# Patient Record
Sex: Female | Born: 1980 | Race: White | Hispanic: No | Marital: Single | Smoking: Current every day smoker
Health system: Southern US, Community
[De-identification: ages and names within clinical notes are randomized; demographics above are authoritative.]

---

## 2000-01-24 ENCOUNTER — Emergency Department (HOSPITAL_COMMUNITY): Admission: EM | Admit: 2000-01-24 | Discharge: 2000-01-24 | Payer: Self-pay | Admitting: Emergency Medicine

## 2000-01-24 ENCOUNTER — Encounter: Payer: Self-pay | Admitting: Emergency Medicine

## 2002-09-30 ENCOUNTER — Encounter: Payer: Self-pay | Admitting: Emergency Medicine

## 2002-09-30 ENCOUNTER — Emergency Department (HOSPITAL_COMMUNITY): Admission: EM | Admit: 2002-09-30 | Discharge: 2002-09-30 | Payer: Self-pay | Admitting: Emergency Medicine

## 2002-11-07 ENCOUNTER — Other Ambulatory Visit: Admission: RE | Admit: 2002-11-07 | Discharge: 2002-11-07 | Payer: Self-pay | Admitting: Obstetrics and Gynecology

## 2003-06-01 ENCOUNTER — Inpatient Hospital Stay (HOSPITAL_COMMUNITY): Admission: AD | Admit: 2003-06-01 | Discharge: 2003-06-03 | Payer: Self-pay | Admitting: Obstetrics and Gynecology

## 2003-07-06 ENCOUNTER — Other Ambulatory Visit: Admission: RE | Admit: 2003-07-06 | Discharge: 2003-07-06 | Payer: Self-pay | Admitting: Obstetrics and Gynecology

## 2003-09-11 ENCOUNTER — Emergency Department (HOSPITAL_COMMUNITY): Admission: EM | Admit: 2003-09-11 | Discharge: 2003-09-11 | Payer: Self-pay | Admitting: Emergency Medicine

## 2003-09-11 ENCOUNTER — Encounter: Payer: Self-pay | Admitting: Emergency Medicine

## 2003-10-28 ENCOUNTER — Emergency Department (HOSPITAL_COMMUNITY): Admission: EM | Admit: 2003-10-28 | Discharge: 2003-10-28 | Payer: Self-pay | Admitting: Emergency Medicine

## 2004-01-07 ENCOUNTER — Emergency Department (HOSPITAL_COMMUNITY): Admission: EM | Admit: 2004-01-07 | Discharge: 2004-01-07 | Payer: Self-pay | Admitting: Emergency Medicine

## 2005-07-19 ENCOUNTER — Emergency Department (HOSPITAL_COMMUNITY): Admission: EM | Admit: 2005-07-19 | Discharge: 2005-07-20 | Payer: Self-pay | Admitting: Emergency Medicine

## 2005-07-21 ENCOUNTER — Emergency Department (HOSPITAL_COMMUNITY): Admission: RE | Admit: 2005-07-21 | Discharge: 2005-07-21 | Payer: Self-pay | Admitting: Emergency Medicine

## 2006-06-22 IMAGING — CT CT ABDOMEN W/ CM
1 of 4 series · 14 of 32 positions shown, 19 images · IV contrast (omnipaque)
Comparison: none

CLINICAL DATA: Abdominal pain.
 ABDOMEN CT WITH CONTRAST:
TECHNIQUE: Multidetector CT imaging of the abdomen was performed following the standard protocol during bolus administration of intravenous contrast.
 Contrast:  125  cc Omnipaque 300.
 The lungs are clear without pericardial or pleural effusions.  Mesenteric fatty stranding is seen anteriorly, images 30-33.  This is superior to the stomach and I question whether it is related to an underlying gastric process such as ulcerative disease.  There is no free air noted.  No gastric mass is identified.  Small and large bowel are unremarkable.  There are no other mesenteric inflammatory changes appreciated.  Specifically, the right lower quadrant is unremarkable.  The liver, gallbladder and pancreas are unremarkable.  Kidneys show symmetric uptake and excretion of contrast.  Spleen is normal.
TECHNIQUE: Multidetector CT imaging of the pelvis was performed following the standard protocol during bolus administration of intravenous contrast.
 Low density focus, right adnexa.  Probable incidental cyst.  Uterus normal for age.  Negative for abnormal inflammatory changes.  Sigmoid colon and rectum unremarkable.

[Series 2: abd_pel 5.0 b40f st · axial · 0.55mm/px · z∈[-574,-164]mm · 14 of 94 slices shown, 19 images]
[im 6/94  soft-tissue]
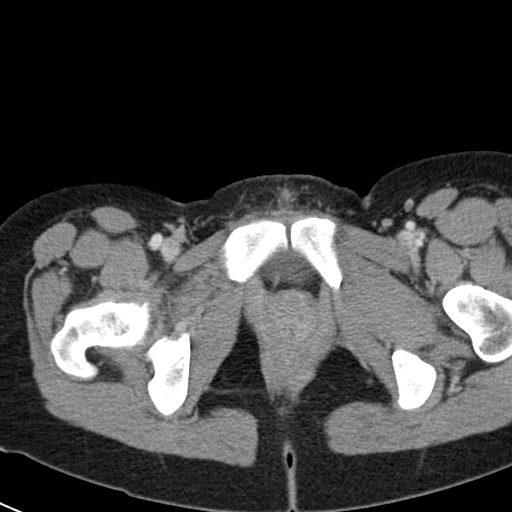
[im 6/94  bone]
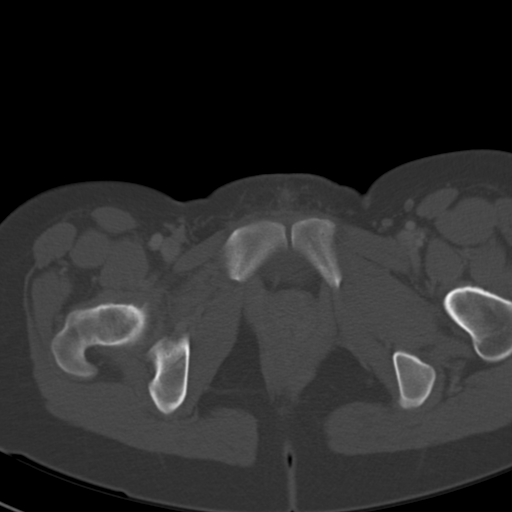
[im 11/94  soft-tissue]
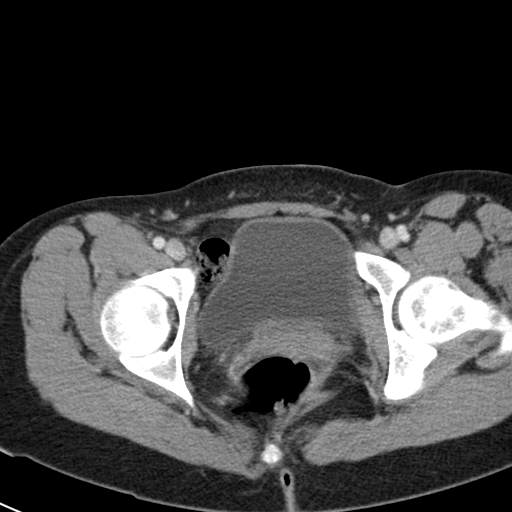
[im 21/94  soft-tissue]
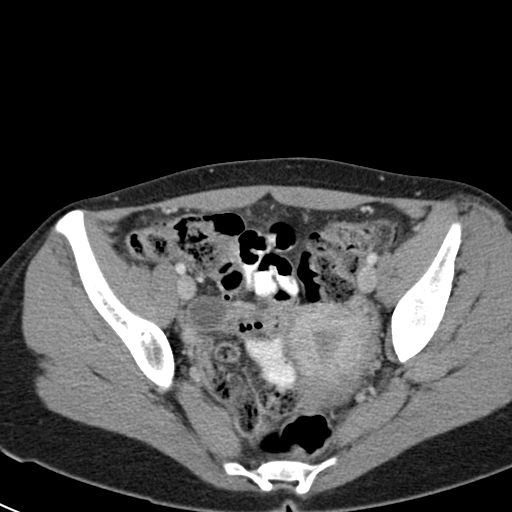
[im 26/94  soft-tissue]
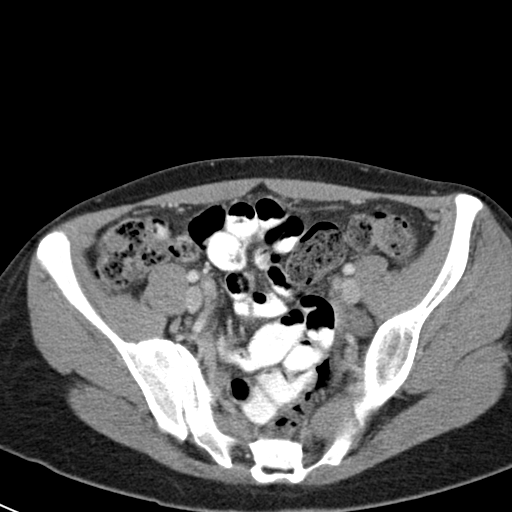
[im 32/94  soft-tissue]
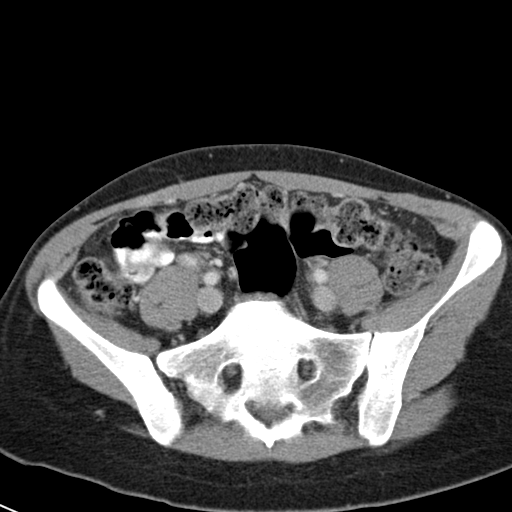
[im 42/94  soft-tissue]
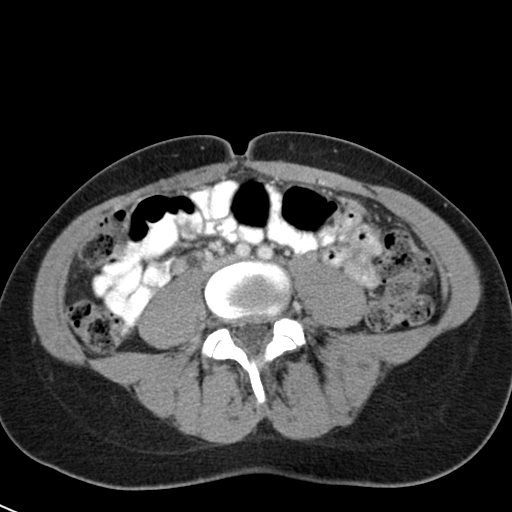
[im 47/94  soft-tissue]
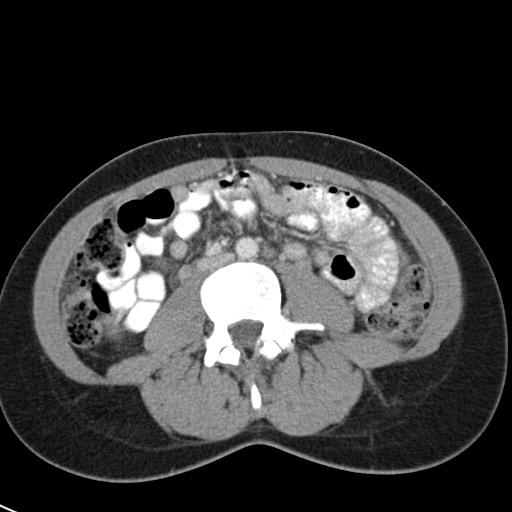
[im 52/94  soft-tissue]
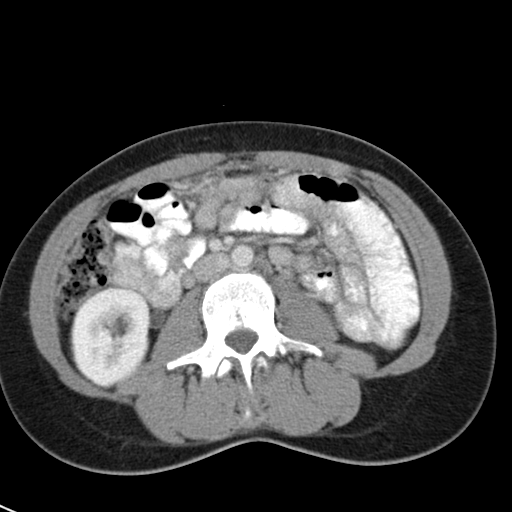
[im 63/94  soft-tissue]
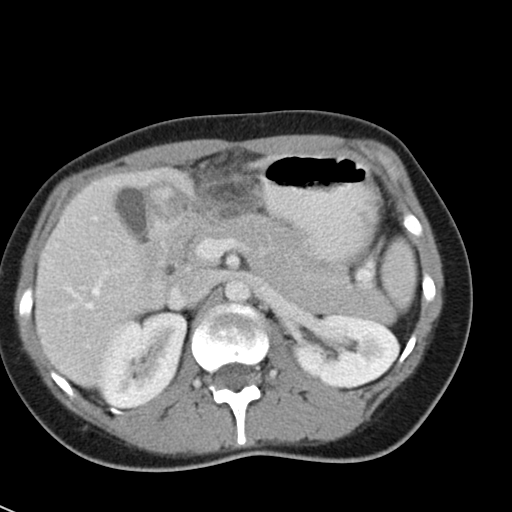
[im 63/94  bone]
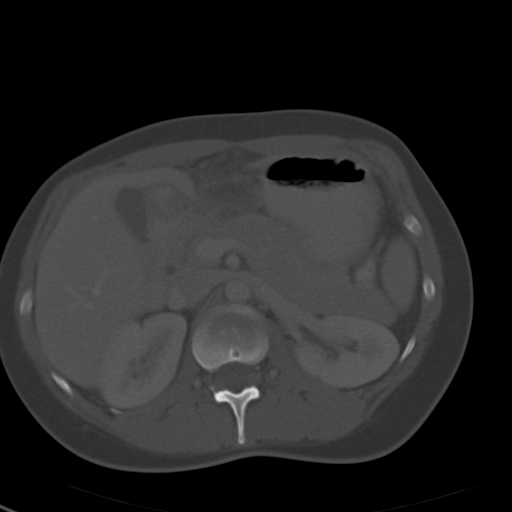
[im 68/94  soft-tissue]
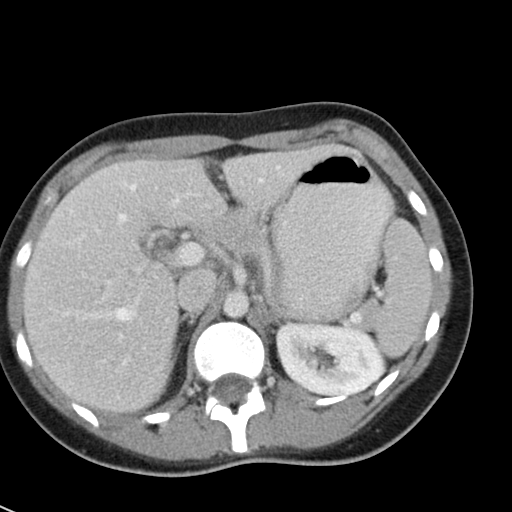
[im 73/94  soft-tissue]
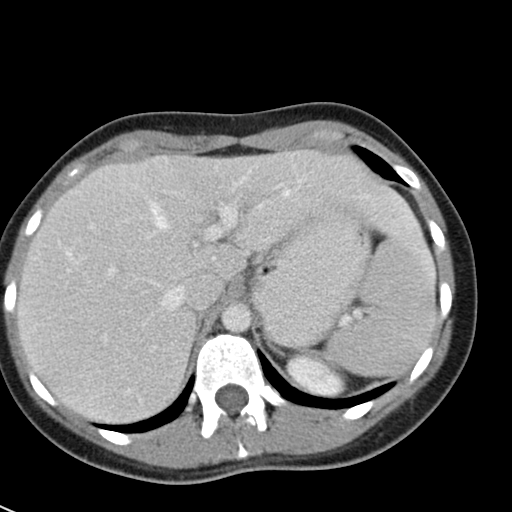
[im 73/94  lung]
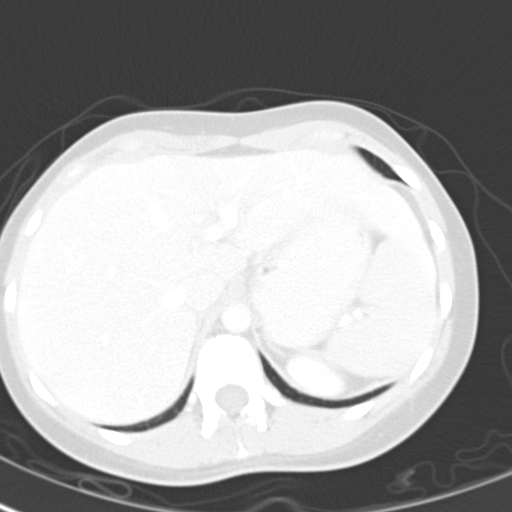
[im 78/94  lung]
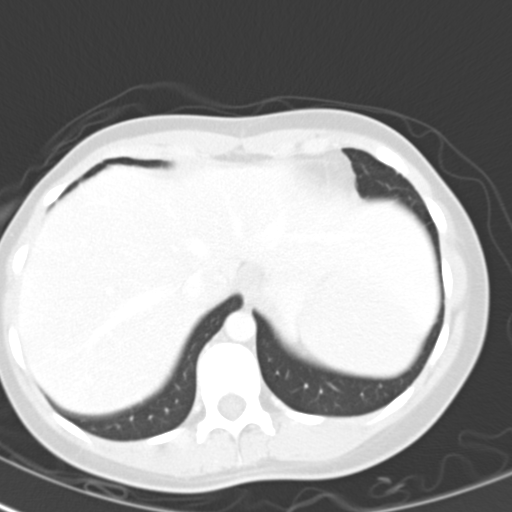
[im 83/94  soft-tissue]
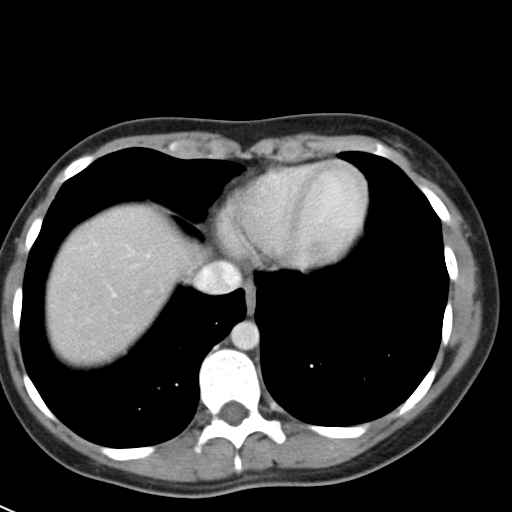
[im 83/94  lung]
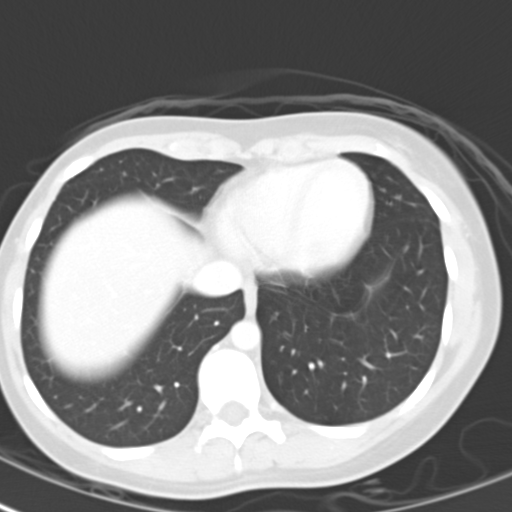
[im 88/94  soft-tissue]
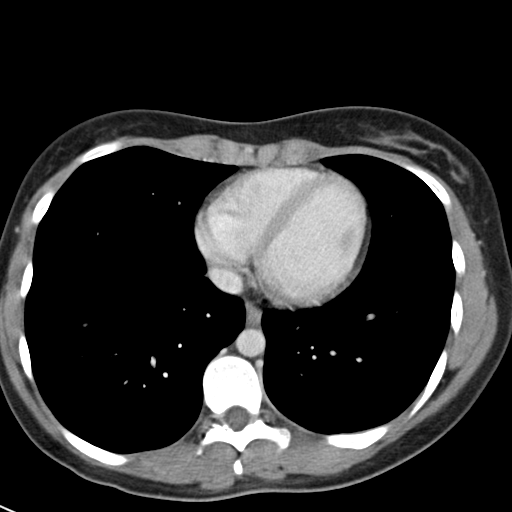
[im 88/94  lung]
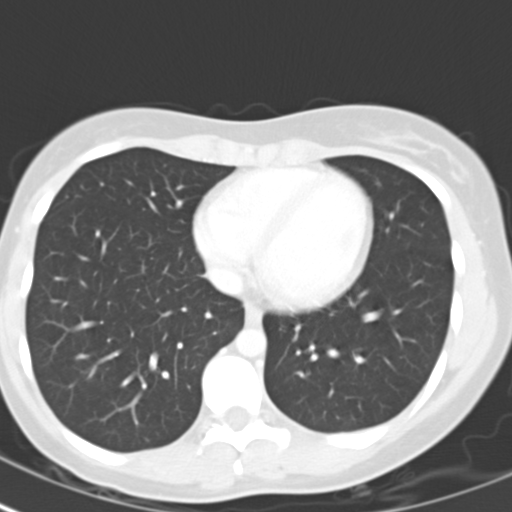

[14 of 32 positions shown; findings below may reference images not displayed]

IMPRESSION: Abnormal mesenteric fatty stranding seen anteriorly, at the level of the stomach.  Question underlying gastric inflammatory process.  Crohn?s disease is a differential consideration.  Ulcerative colitis is believed to be less likely.  
 PELVIS CT WITH CONTRAST:
IMPRESSION: Probable right adnexal cyst, otherwise negative.

## 2007-03-28 ENCOUNTER — Emergency Department (HOSPITAL_COMMUNITY): Admission: EM | Admit: 2007-03-28 | Discharge: 2007-03-28 | Payer: Self-pay | Admitting: Emergency Medicine

## 2008-09-20 ENCOUNTER — Ambulatory Visit (HOSPITAL_COMMUNITY): Admission: RE | Admit: 2008-09-20 | Discharge: 2008-09-20 | Payer: Self-pay | Admitting: Obstetrics and Gynecology

## 2008-11-20 ENCOUNTER — Inpatient Hospital Stay (HOSPITAL_COMMUNITY): Admission: AD | Admit: 2008-11-20 | Discharge: 2008-11-20 | Payer: Self-pay | Admitting: Obstetrics and Gynecology

## 2008-12-20 ENCOUNTER — Inpatient Hospital Stay (HOSPITAL_COMMUNITY): Admission: AD | Admit: 2008-12-20 | Discharge: 2008-12-22 | Payer: Self-pay | Admitting: Obstetrics and Gynecology

## 2009-08-22 IMAGING — US US RENAL
1 series · 13 of 25 positions shown · non-contrast
Comparison: Prior ultrasound of 07/21/2005

CLINICAL DATA: Abdominal pain.  Hematuria.

RENAL/URINARY TRACT ULTRASOUND
TECHNIQUE: Complete ultrasound examination of the urinary tract
was performed including evaluation of the kidneys, renal collecting
systems, and urinary bladder.

[Series 1: us renal · 13 of 46 slices shown]
[im 1/46]
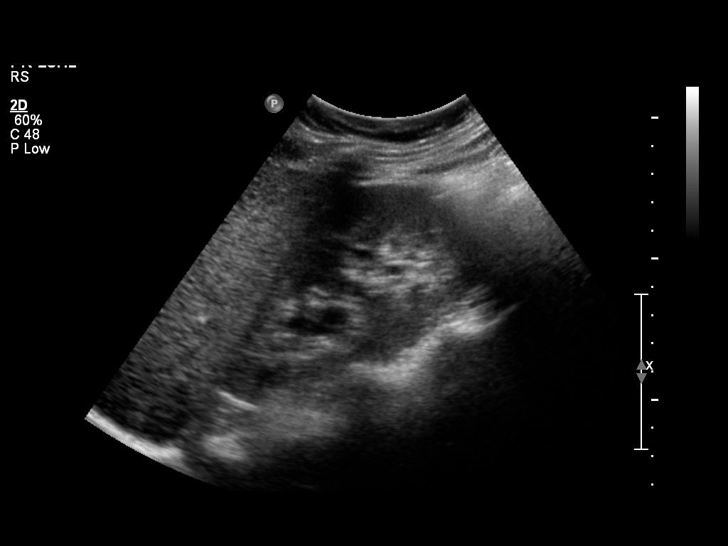
[im 4/46]
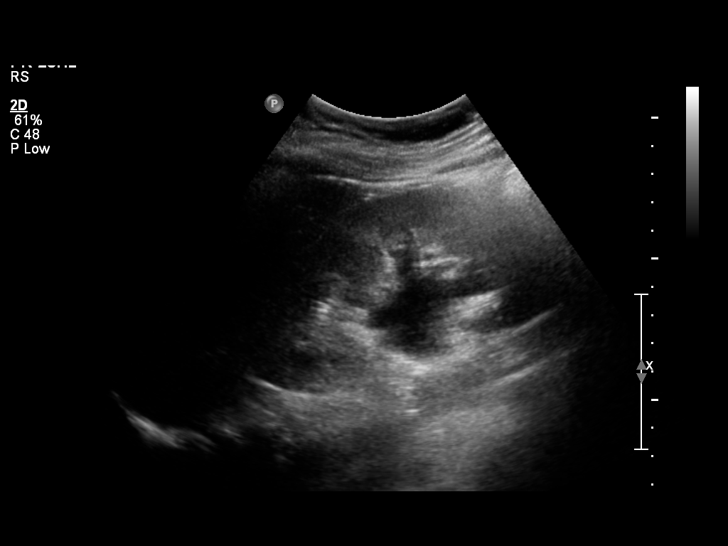
[im 8/46]
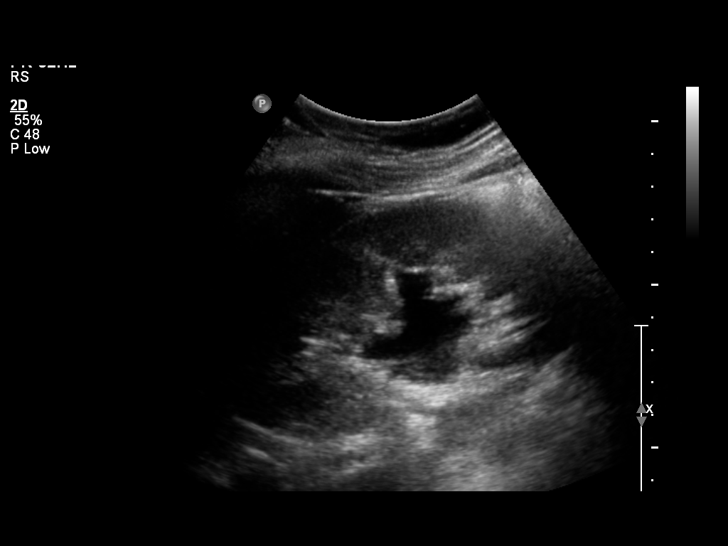
[im 12/46]
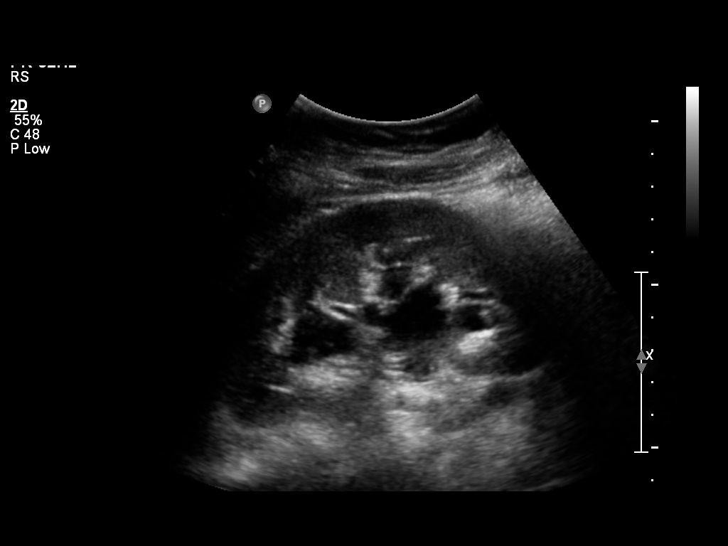
[im 16/46]
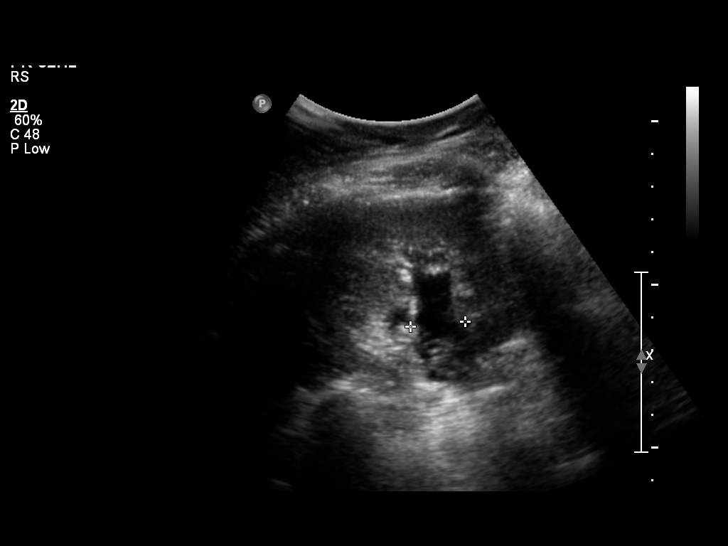
[im 19/46]
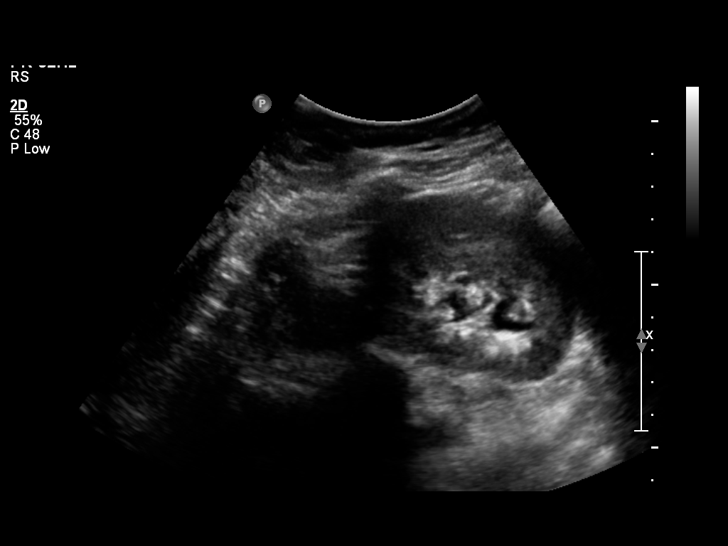
[im 23/46]
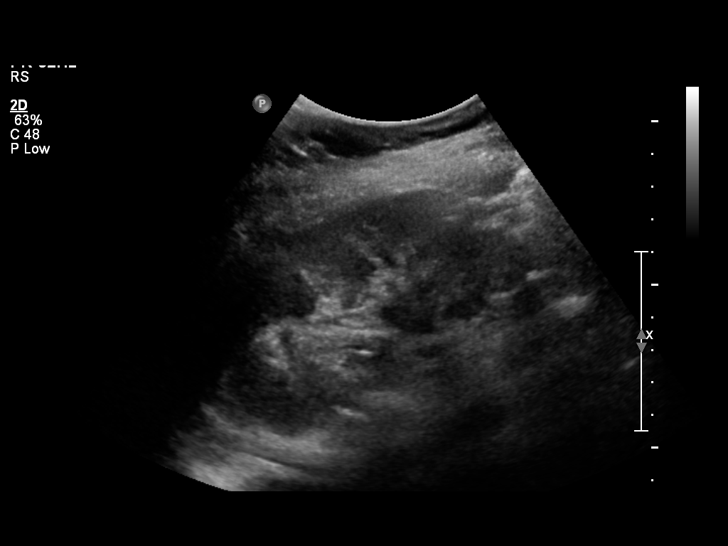
[im 27/46]
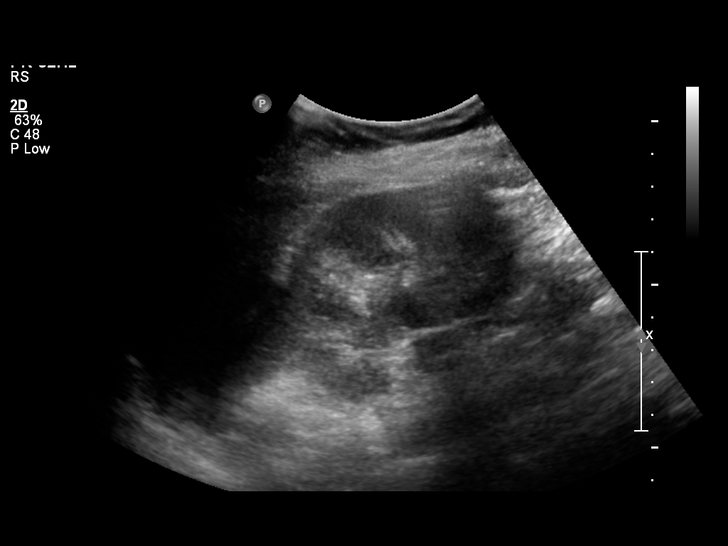
[im 31/46]
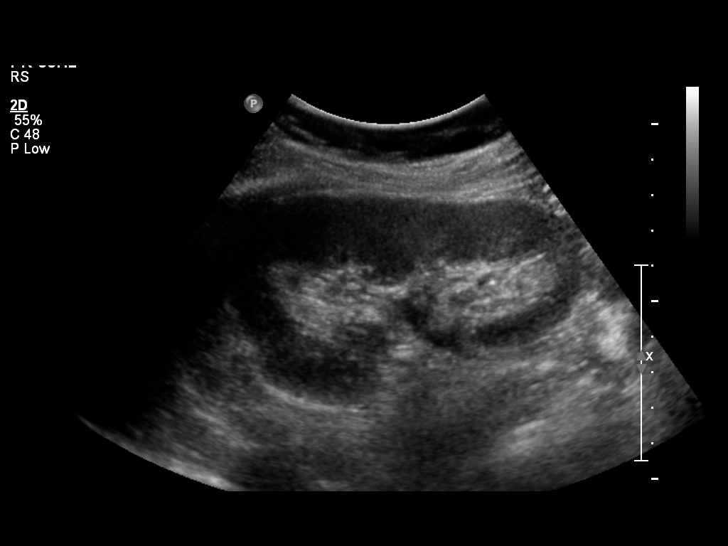
[im 34/46]
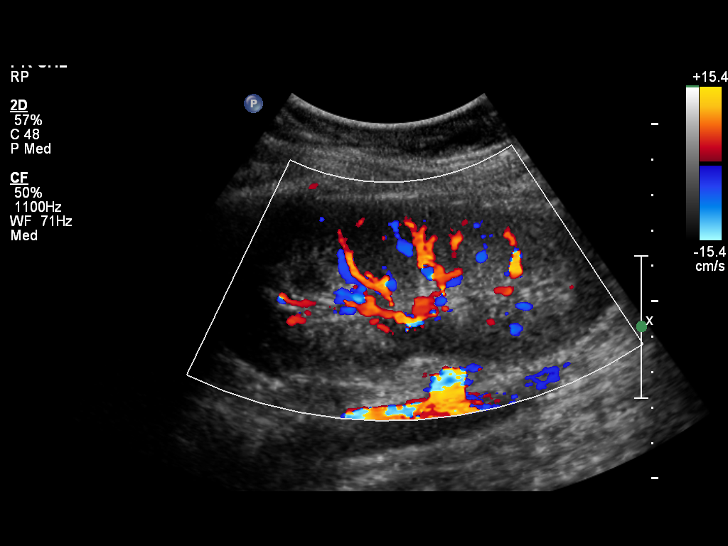
[im 38/46]
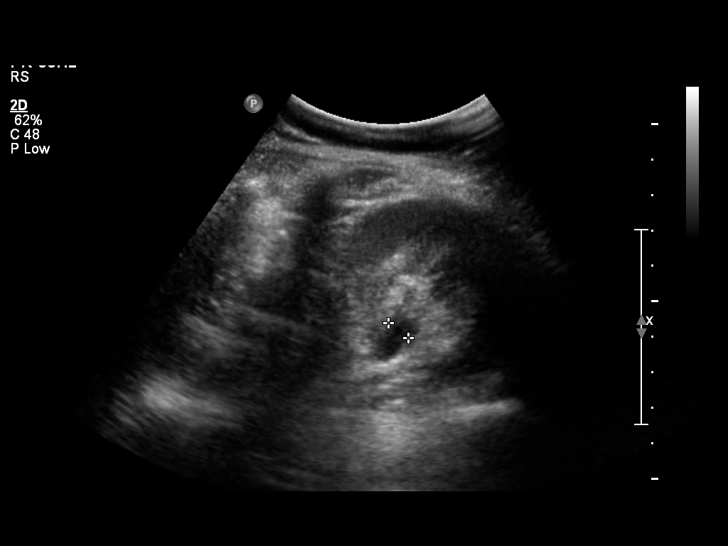
[im 42/46]
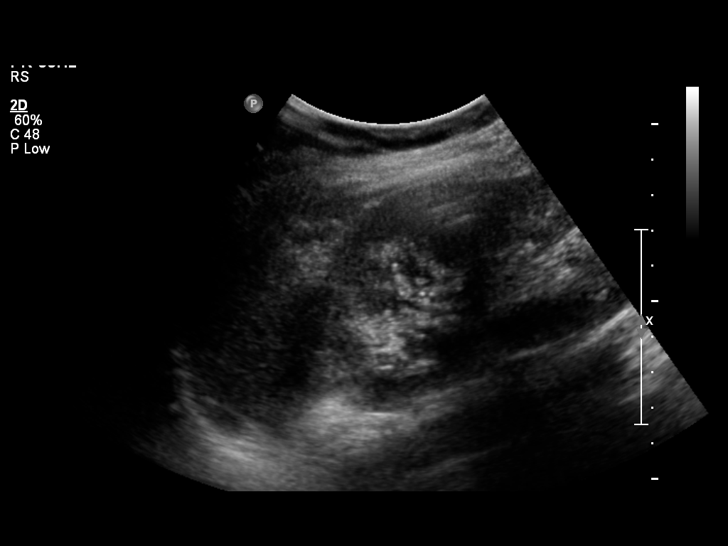
[im 46/46]
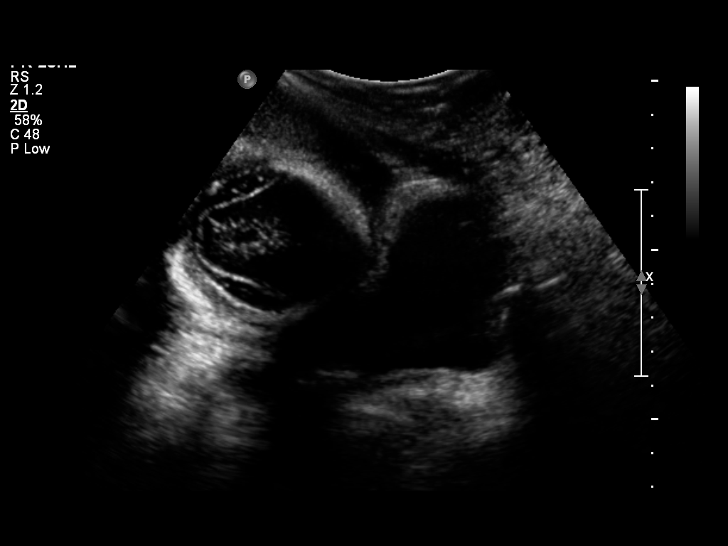

[13 of 25 positions shown; findings below may reference images not displayed]

FINDINGS: The right kidney measures 10.4 cm in greatest length and
demonstrates mild right hydronephrosis.  No discrete renal calculus
is identified.    No definite right renal scarring is identified.
No renal masses noted.

Several transverse images demonstrate possible nonshadowing filling
defects in the dilated calyces. I personally spoke with the
ultrasound technologist, who noted that at real time imaging these
represented the tips of medullary pyramids rather than true filling
defects.

The left kidney measures 10.0 cm in greatest length, and appears
otherwise unremarkable sonographically.

The urinary bladder demonstrates a left-sided ureteral jet.
Consistent right ureteral jet was not shown on today's exam.
Gravid uterus is noted adjacent to the urinary bladder.
IMPRESSION: 1.  Mild right hydronephrosis, and lack of demonstrable right
ureteral jet. This raises the possibility of partial right ureteral
obstruction.
2.  No visualized stone or mass.

## 2011-05-06 NOTE — H&P (Signed)
NAME:  Karen Sandoval, Karen Sandoval NO.:  [id number]   MEDICAL RECORD NO.:  [id number]          PATIENT TYPE:  INP   LOCATION:  9146                          FACILITY:  [facility]   PHYSICIAN:  Lenoard Aden, M.D.DATE OF BIRTH:  1980-12-25   DATE OF ADMISSION:  12/20/2008  DATE OF DISCHARGE:                              HISTORY & PHYSICAL   CHIEF COMPLAINT:  Labor.   She is a 30 year old white female, G2, P1, at 32 week's gestation in  active labor.   She has allergies to PENICILLIN.   MEDICATIONS:  Prenatal vitamins.   Nonsmoker, nondrinker.  Denies domestic or physical violence.   Family history of heart disease, diabetes, breast cancer, depression,  and anxiety disorder.   Pregnancy course today otherwise uncomplicated.  History of one  spontaneous vaginal delivery at 42 weeks, 8 pound 12 ounce child.   PHYSICAL EXAMINATION:  GENERAL:  She is a well-developed, well-nourished  white female in no acute distress.  HEENT:  Normal.  LUNGS:  Clear.  HEART:  Regular rhythm.  ABDOMEN:  Soft, gravid, nontender.  Estimated fetal weight pounds.  Cervix is 5 cm, 100% vertex, 0 station.  EXTREMITIES:  There were no cords.  NEUROLOGIC:  Nonfocal.  SKIN:  Intact.   IMPRESSION:  38-week intrauterine pregnancy in active labor.   PLAN:  Anticipate attempts at vaginal delivery, epidural ordered.      Lenoard Aden, M.D.  Electronically Signed     RJT/MEDQ  D:  12/20/2008  T:  12/21/2008  Job:  [id number]

## 2011-09-26 LAB — CBC
HCT: 31.3 % — ABNORMAL LOW (ref 36.0–46.0)
MCHC: 33.6 g/dL (ref 30.0–36.0)
Platelets: 239 10*3/uL (ref 150–400)
RBC: 3.84 MIL/uL — ABNORMAL LOW (ref 3.87–5.11)
WBC: 15.1 10*3/uL — ABNORMAL HIGH (ref 4.0–10.5)

## 2011-09-26 LAB — RPR: RPR Ser Ql: NONREACTIVE

## 2012-10-26 ENCOUNTER — Emergency Department (HOSPITAL_COMMUNITY)
Admission: EM | Admit: 2012-10-26 | Discharge: 2012-10-26 | Discharge status: Against Medical Advice | Payer: Self-pay | Attending: Emergency Medicine | Admitting: Emergency Medicine

## 2012-10-26 ENCOUNTER — Emergency Department (HOSPITAL_BASED_OUTPATIENT_CLINIC_OR_DEPARTMENT_OTHER)
Admission: EM | Admit: 2012-10-26 | Discharge: 2012-10-26 | Disposition: A | Discharge status: Home / Self Care | Payer: Self-pay | Attending: Emergency Medicine | Admitting: Emergency Medicine

## 2012-10-26 ENCOUNTER — Encounter (HOSPITAL_COMMUNITY): Payer: Self-pay | Admitting: Emergency Medicine

## 2012-10-26 ENCOUNTER — Encounter (HOSPITAL_BASED_OUTPATIENT_CLINIC_OR_DEPARTMENT_OTHER): Payer: Self-pay | Admitting: *Deleted

## 2012-10-26 DIAGNOSIS — Y929 Unspecified place or not applicable: Secondary | ICD-10-CM | POA: Insufficient documentation

## 2012-10-26 DIAGNOSIS — W268XXA Contact with other sharp object(s), not elsewhere classified, initial encounter: Secondary | ICD-10-CM | POA: Insufficient documentation

## 2012-10-26 DIAGNOSIS — Y939 Activity, unspecified: Secondary | ICD-10-CM | POA: Insufficient documentation

## 2012-10-26 DIAGNOSIS — Z23 Encounter for immunization: Secondary | ICD-10-CM | POA: Insufficient documentation

## 2012-10-26 DIAGNOSIS — F17200 Nicotine dependence, unspecified, uncomplicated: Secondary | ICD-10-CM | POA: Insufficient documentation

## 2012-10-26 DIAGNOSIS — S51009A Unspecified open wound of unspecified elbow, initial encounter: Secondary | ICD-10-CM | POA: Insufficient documentation

## 2012-10-26 DIAGNOSIS — S51809A Unspecified open wound of unspecified forearm, initial encounter: Secondary | ICD-10-CM | POA: Insufficient documentation

## 2012-10-26 DIAGNOSIS — S51819A Laceration without foreign body of unspecified forearm, initial encounter: Secondary | ICD-10-CM

## 2012-10-26 DIAGNOSIS — Y9389 Activity, other specified: Secondary | ICD-10-CM | POA: Insufficient documentation

## 2012-10-26 MED ORDER — LIDOCAINE-EPINEPHRINE-TETRACAINE (LET) SOLUTION
NASAL | Status: AC
  Administered 2012-10-26: 3 mL via TOPICAL
  Filled 2012-10-26: qty 3

## 2012-10-26 MED ORDER — LIDOCAINE-EPINEPHRINE 2 %-1:100000 IJ SOLN
20.0000 mL | Freq: Once | INTRAMUSCULAR | Status: AC
  Administered 2012-10-26: 1 mL

## 2012-10-26 MED ORDER — LIDOCAINE-EPINEPHRINE-TETRACAINE (LET) SOLUTION
3.0000 mL | Freq: Once | NASAL | Status: AC
  Administered 2012-10-26: 3 mL via TOPICAL

## 2012-10-26 MED ORDER — LORAZEPAM 1 MG PO TABS
ORAL_TABLET | ORAL | Status: AC
  Administered 2012-10-26: 2 mg via ORAL
  Filled 2012-10-26: qty 2

## 2012-10-26 MED ORDER — LIDOCAINE-EPINEPHRINE-TETRACAINE (LET) SOLUTION
3.0000 mL | Freq: Once | NASAL | Status: AC
  Administered 2012-10-26: 3 mL via TOPICAL
  Filled 2012-10-26: qty 3

## 2012-10-26 MED ORDER — LIDOCAINE-EPINEPHRINE 2 %-1:100000 IJ SOLN
INTRAMUSCULAR | Status: AC
  Administered 2012-10-26: 1 mL
  Filled 2012-10-26: qty 1

## 2012-10-26 MED ORDER — LORAZEPAM 1 MG PO TABS
2.0000 mg | ORAL_TABLET | Freq: Once | ORAL | Status: AC
  Administered 2012-10-26: 2 mg via ORAL

## 2012-10-26 MED ORDER — TETANUS-DIPHTH-ACELL PERTUSSIS 5-2.5-18.5 LF-MCG/0.5 IM SUSP
0.5000 mL | Freq: Once | INTRAMUSCULAR | Status: AC
  Administered 2012-10-26: 0.5 mL via INTRAMUSCULAR
  Filled 2012-10-26: qty 0.5

## 2012-10-26 NOTE — ED Provider Notes (Signed)
History     CSN: [id number]  Arrival date & time 10/26/12  0109   First MD Initiated Contact with Patient 10/26/12 0123      Chief Complaint  Patient presents with  . Extremity Laceration    (Consider location/radiation/quality/duration/timing/severity/associated sxs/prior treatment) HPI This 31 year old female accidentally cut her right forearm on some glass when she angry and broke a window, this occurred a couple hours ago, she has no distal weakness or numbness, she is no foreign body sensation, she has localized tenderness only without lesion or associated symptoms, the bleeding is controlled, there is no treatment prior to arrival, she does not know the last time she had tetanus shot, her pain is mild to moderate and localized. History reviewed. No pertinent past medical history.  History reviewed. No pertinent past surgical history.  No family history on file.  History  Substance Use Topics  . Smoking status: Current Every Day Smoker -- 0.2 packs/day    Types: Cigarettes  . Smokeless tobacco: Not on file  . Alcohol Use: Yes     Comment: occasion    OB History    Grav Para Term Preterm Abortions TAB SAB Ect Mult Living                  Review of Systems 10 Systems reviewed and are negative for acute change except as noted in the HPI. Allergies  Penicillins  Home Medications  No current outpatient prescriptions on file.  BP 150/88  Pulse 115  Temp 98.6 F (37 C) (Oral)  Resp 18  Ht 5\' 6"  (1.676 m)  Wt 123 lb (55.792 kg)  BMI 19.85 kg/m2  SpO2 98%  LMP 10/05/2012  Physical Exam  Nursing note and vitals reviewed. Constitutional:       Awake, alert, nontoxic appearance.  HENT:  Head: Atraumatic.  Eyes: Right eye exhibits no discharge. Left eye exhibits no discharge.  Neck: Neck supple.       C-S NT  Pulmonary/Chest: Effort normal. She exhibits no tenderness.  Abdominal: Soft. There is no tenderness. There is no rebound.  Musculoskeletal: She  exhibits tenderness.       Baseline ROM, no obvious new focal weakness. Left arm and both legs nontender. Right arm has no bony tenderness no joint tenderness. The right shoulder elbow wrist and hand are all nontender. The right hand has capillary refill less than 2 seconds in all digits with normal light touch and 5 out of 5 strength in the distributions of the median radial and ulnar nerve function. Her right forearm proximally has an irregular 4 cm full-thickness laceration with no foreign body noted no significant deep structure involvement noted such is no bone muscle, tendon, or significant neurovascular involvement noted.  Neurological:       Mental status and motor strength appears baseline for patient and situation.  Skin: No rash noted.  Psychiatric: She has a normal mood and affect.    ED Course  Procedures (including critical care time) Lac Repair: Timeout taken, LET solution applied with partial local anesthesia, wounds cleansed, explored, 2% lidocaine with epinephrine for additional local anesthesia used, wound closed with 6 staples, good wound margin reapproximation, patient tolerated procedure well with no apparent immediate complications.  Labs Reviewed - No data to display No results found.   1. Laceration of forearm       MDM  Pt stable in ED with no significant deterioration in condition.  Patient / Family / Caregiver informed of clinical course, understand  medical decision-making process, and agree with plan.  I doubt any other EMC precluding discharge.         Hurman Horn, MD 10/26/12 [phone]

## 2012-10-26 NOTE — ED Notes (Addendum)
Pt reports about an hour ago, elbow glass window, and cut R f/a, about 2.5 in long; no active bleeding; unsure of last tetanus shot; reports on abx for bronchitis and strep-dx on Thursday; pt also does report ETOH tonight

## 2012-10-26 NOTE — ED Notes (Signed)
Pt struck her right elbow on a window and it broke lacerating her right forearm.

## 2012-10-26 NOTE — ED Notes (Signed)
MD at bedside. 

## 2015-03-19 ENCOUNTER — Encounter: Payer: Self-pay | Admitting: Internal Medicine

## 2015-05-10 ENCOUNTER — Ambulatory Visit: Payer: Self-pay | Admitting: Internal Medicine

## 2015-05-11 ENCOUNTER — Encounter: Payer: Self-pay | Admitting: Internal Medicine

## 2015-05-11 NOTE — Progress Notes (Signed)
Patient ID: Karen Sandoval, female   DOB: 07/02/1981, 34 y.o.   MRN: [id number] The patient's chart has been reviewed by Dr. Leone PayorGessner  and the recommendations are noted below.  .  Follow-up advised. Schedule patient for next available appointment. Outcome of communication with the patient:  Letter mailed

## 2016-04-21 ENCOUNTER — Encounter (HOSPITAL_COMMUNITY): Payer: Self-pay | Admitting: *Deleted

## 2016-04-21 ENCOUNTER — Emergency Department (HOSPITAL_COMMUNITY): Payer: BLUE CROSS/BLUE SHIELD

## 2016-04-21 ENCOUNTER — Emergency Department (HOSPITAL_COMMUNITY)
Admission: EM | Admit: 2016-04-21 | Discharge: 2016-04-21 | Disposition: A | Discharge status: Home / Self Care | Payer: BLUE CROSS/BLUE SHIELD | Attending: Emergency Medicine | Admitting: Emergency Medicine

## 2016-04-21 DIAGNOSIS — R51 Headache: Secondary | ICD-10-CM | POA: Diagnosis not present

## 2016-04-21 DIAGNOSIS — F17210 Nicotine dependence, cigarettes, uncomplicated: Secondary | ICD-10-CM | POA: Diagnosis not present

## 2016-04-21 DIAGNOSIS — Z880 Allergy status to penicillin: Secondary | ICD-10-CM | POA: Diagnosis not present

## 2016-04-21 DIAGNOSIS — R519 Headache, unspecified: Secondary | ICD-10-CM

## 2016-04-21 MED ORDER — ONDANSETRON 4 MG PO TBDP
ORAL_TABLET | ORAL | Status: AC
  Filled 2016-04-21: qty 1

## 2016-04-21 MED ORDER — KETOROLAC TROMETHAMINE 30 MG/ML IJ SOLN
30.0000 mg | Freq: Once | INTRAMUSCULAR | Status: AC
  Administered 2016-04-21: 30 mg via INTRAVENOUS
  Filled 2016-04-21: qty 1

## 2016-04-21 MED ORDER — DIPHENHYDRAMINE HCL 50 MG/ML IJ SOLN
25.0000 mg | Freq: Once | INTRAMUSCULAR | Status: AC
  Administered 2016-04-21: 25 mg via INTRAVENOUS
  Filled 2016-04-21: qty 1

## 2016-04-21 MED ORDER — METOCLOPRAMIDE HCL 5 MG/ML IJ SOLN
10.0000 mg | Freq: Once | INTRAMUSCULAR | Status: AC
  Administered 2016-04-21: 10 mg via INTRAVENOUS
  Filled 2016-04-21: qty 2

## 2016-04-21 MED ORDER — SODIUM CHLORIDE 0.9 % IV BOLUS (SEPSIS)
1000.0000 mL | Freq: Once | INTRAVENOUS | Status: AC
  Administered 2016-04-21: 1000 mL via INTRAVENOUS

## 2016-04-21 MED ORDER — ONDANSETRON 4 MG PO TBDP
4.0000 mg | ORAL_TABLET | Freq: Once | ORAL | Status: AC
  Administered 2016-04-21: 4 mg via ORAL

## 2016-04-21 NOTE — Discharge Instructions (Signed)
Karen Sandoval,  Nice meeting you! Please follow-up with your primary care provider. Return to the emergency department if you develop fevers, chills, neck stiffness, increased headache, visual changes, numbness/tingling, slurred speech, new/worsening symptoms. Feel better soon!  S. Lane HackerNicole Cheyla Duchemin, PA-C General Headache Without Cause A headache is pain or discomfort felt around the head or neck area. The specific cause of a headache may not be found. There are many causes and types of headaches. A few common ones are:  Tension headaches.  Migraine headaches.  Cluster headaches.  Chronic daily headaches. HOME CARE INSTRUCTIONS  Watch your condition for any changes. Take these steps to help with your condition: Managing Pain  Take over-the-counter and prescription medicines only as told by your health care provider.  Lie down in a dark, quiet room when you have a headache.  If directed, apply ice to the head and neck area:  Put ice in a plastic bag.  Place a towel between your skin and the bag.  Leave the ice on for 20 minutes, 2-3 times per day.  Use a heating pad or hot shower to apply heat to the head and neck area as told by your health care provider.  Keep lights dim if bright lights bother you or make your headaches worse. Eating and Drinking  Eat meals on a regular schedule.  Limit alcohol use.  Decrease the amount of caffeine you drink, or stop drinking caffeine. General Instructions  Keep all follow-up visits as told by your health care provider. This is important.  Keep a headache journal to help find out what may trigger your headaches. For example, write down:  What you eat and drink.  How much sleep you get.  Any change to your diet or medicines.  Try massage or other relaxation techniques.  Limit stress.  Sit up straight, and do not tense your muscles.  Do not use tobacco products, including cigarettes, chewing tobacco, or e-cigarettes. If  you need help quitting, ask your health care provider.  Exercise regularly as told by your health care provider.  Sleep on a regular schedule. Get 7-9 hours of sleep, or the amount recommended by your health care provider. SEEK MEDICAL CARE IF:   Your symptoms are not helped by medicine.  You have a headache that is different from the usual headache.  You have nausea or you vomit.  You have a fever. SEEK IMMEDIATE MEDICAL CARE IF:   Your headache becomes severe.  You have repeated vomiting.  You have a stiff neck.  You have a loss of vision.  You have problems with speech.  You have pain in the eye or ear.  You have muscular weakness or loss of muscle control.  You lose your balance or have trouble walking.  You feel faint or pass out.  You have confusion.   This information is not intended to replace advice given to you by your health care provider. Make sure you discuss any questions you have with your health care provider.   Document Released: 12/08/2005 Document Revised: 08/29/2015 Document Reviewed: 04/02/2015 Elsevier Interactive Patient Education [employer]2016 [employer]

## 2016-04-21 NOTE — ED Notes (Signed)
Family member to triage states pt is still having HA and needs meds. Cindy RN made aware.

## 2016-04-21 NOTE — ED Notes (Signed)
Patient presents with c/o worst headache ever.  Stated she has had a headache for about the past 7 days but this is the worst.  Went to sleep and woke up with this headache +nausea and vomiting

## 2016-04-21 NOTE — ED Provider Notes (Signed)
CSN: [id number]     Arrival date & time 04/21/16  0539 History   First MD Initiated Contact with Patient 04/21/16 0719     Chief Complaint  Patient presents with  . Headache   HPI  Karen Sandoval is a 35 y.o. female with no significant PMH presenting with a 7 day history of worsening headache. She states she woke this morning around 1 AM with this headache. She describes as 7 onset, worst headache ever, constant, 10 out of 10 pain scale, throbbing, occipital in location, nonradiating, not alleviated by ibuprofen. She states over the last week her headaches have started in the morning, improved with ibuprofen by lunchtime. She endorses 2 episodes of emesis this morning which have been nonbloody and nonbilious. She has photophobia as well. She denies fevers, chills, cough, recent infections, chest pain, shortness of breath, diarrhea, changes in bowel or bladder habits.   History reviewed. No pertinent past medical history. History reviewed. No pertinent past surgical history. No family history on file. Social History  Substance Use Topics  . Smoking status: Current Every Day Smoker -- 0.20 packs/day    Types: Cigarettes  . Smokeless tobacco: Never Used  . Alcohol Use: Yes     Comment: occasion   OB History    No data available     Review of Systems  Ten systems are reviewed and are negative for acute change except as noted in the HPI  Allergies  Penicillins  Home Medications   Prior to Admission medications   Not on File   BP 154/104 mmHg  Pulse 88  Temp(Src) 97.8 F (36.6 C) (Oral)  Resp 18  Ht 5\' 6"  (1.676 m)  Wt 56.7 kg  BMI 20.19 kg/m2  SpO2 99%  LMP 04/06/2016 Physical Exam  Constitutional: She is oriented to person, place, and time. She appears well-developed and well-nourished. No distress.  HENT:  Head: Normocephalic and atraumatic.  Right Ear: External ear normal.  Left Ear: External ear normal.  Mouth/Throat: Oropharynx is clear and moist. No  oropharyngeal exudate.  Normal TMs bilaterally.  Eyes: Conjunctivae are normal. Pupils are equal, round, and reactive to light. Right eye exhibits no discharge. Left eye exhibits no discharge. No scleral icterus.  Neck: No tracheal deviation present.  Cardiovascular: Normal rate, regular rhythm, normal heart sounds and intact distal pulses.  Exam reveals no gallop and no friction rub.   No murmur heard. Pulmonary/Chest: Effort normal and breath sounds normal. No respiratory distress. She has no wheezes. She has no rales. She exhibits no tenderness.  Abdominal: Soft. Bowel sounds are normal. She exhibits no distension and no mass. There is no tenderness. There is no rebound and no guarding.  Musculoskeletal: Normal range of motion. She exhibits no edema or tenderness.  Lymphadenopathy:    She has no cervical adenopathy.  Neurological: She is alert and oriented to person, place, and time. No cranial nerve deficit. Coordination normal.  Cranial nerves II through XII intact. Normal finger to nose, pronator drift, RAM.   Skin: Skin is warm and dry. No rash noted. She is not diaphoretic. No erythema.  Psychiatric: She has a normal mood and affect. Her behavior is normal.  Nursing note and vitals reviewed.   ED Course  Procedures  Imaging Review Ct Head Wo Contrast  04/21/2016  CLINICAL DATA:  Woke up at 4 a.m. with worst headache of life, vomiting. EXAM: CT HEAD WITHOUT CONTRAST TECHNIQUE: Contiguous axial images were obtained from the base of the skull through  the vertex without intravenous contrast. COMPARISON:  None. FINDINGS: INTRACRANIAL CONTENTS: The ventricles and sulci are normal. No intraparenchymal hemorrhage, mass effect nor midline shift. No acute large vascular territory infarcts. No abnormal extra-axial fluid collections. Basal cisterns are patent. ORBITS: The included ocular globes and orbital contents are normal. SINUSES: The mastoid aircells and included paranasal sinuses are  well-aerated. SKULL/SOFT TISSUES: No skull fracture. No significant soft tissue swelling. IMPRESSION: Normal CT HEAD. Electronically Signed   By: Awilda Metro M.D.   On: 04/21/2016 06:47   I have personally reviewed and evaluated these images and lab results as part of my medical decision-making.   MDM   Final diagnoses:  Headache, unspecified headache type   Patient nontoxic-appearing, vital signs stable. Based on patient history and physical exam, most likely etiology is primary HA. Less likely vascular, trauma, infection, CSF disorder, environmental. CT head ordered prior to my evaluation is unremarkable. Upon reassessment, patient's headache has completely resolved with headache cocktail. Patient may be safely discharged home. Discussed reasons for return. Patient to follow-up with primary care provider within one week. Patient in understanding and agreement with the plan.   Melton Krebs, PA-C 04/21/16 0940  Benjiman Core, MD 04/21/16 1600
